# Patient Record
Sex: Female | Born: 1935 | Race: Black or African American | Hispanic: No | Marital: Married
Health system: Southern US, Community
[De-identification: ages and names within clinical notes are randomized; demographics above are authoritative.]

---

## 2008-06-11 ENCOUNTER — Emergency Department: Payer: Self-pay | Admitting: Emergency Medicine

## 2008-06-11 ENCOUNTER — Other Ambulatory Visit: Payer: Self-pay

## 2008-06-13 ENCOUNTER — Ambulatory Visit: Payer: Self-pay | Admitting: Internal Medicine

## 2008-06-27 ENCOUNTER — Ambulatory Visit: Payer: Self-pay | Admitting: Oncology

## 2008-06-28 ENCOUNTER — Ambulatory Visit: Payer: Self-pay | Admitting: Oncology

## 2008-07-02 ENCOUNTER — Emergency Department: Payer: Self-pay | Admitting: Emergency Medicine

## 2008-07-05 ENCOUNTER — Ambulatory Visit: Payer: Self-pay | Admitting: Oncology

## 2008-07-06 ENCOUNTER — Ambulatory Visit: Payer: Self-pay | Admitting: Oncology

## 2008-07-09 ENCOUNTER — Ambulatory Visit: Payer: Self-pay | Admitting: Oncology

## 2008-07-20 ENCOUNTER — Ambulatory Visit: Payer: Self-pay | Admitting: Oncology

## 2008-07-27 ENCOUNTER — Ambulatory Visit: Payer: Self-pay | Admitting: Oncology

## 2008-07-28 ENCOUNTER — Ambulatory Visit: Payer: Self-pay | Admitting: Oncology

## 2008-08-18 ENCOUNTER — Emergency Department: Payer: Self-pay | Admitting: Emergency Medicine

## 2008-08-28 ENCOUNTER — Ambulatory Visit: Payer: Self-pay | Admitting: Oncology

## 2008-09-27 ENCOUNTER — Ambulatory Visit: Payer: Self-pay | Admitting: Oncology

## 2008-10-28 ENCOUNTER — Ambulatory Visit: Payer: Self-pay | Admitting: Oncology

## 2008-11-28 ENCOUNTER — Ambulatory Visit: Payer: Self-pay | Admitting: Oncology

## 2008-12-26 ENCOUNTER — Ambulatory Visit: Payer: Self-pay | Admitting: Oncology

## 2009-01-26 ENCOUNTER — Ambulatory Visit: Payer: Self-pay | Admitting: Oncology

## 2009-07-04 ENCOUNTER — Inpatient Hospital Stay: Payer: Self-pay | Admitting: Internal Medicine

## 2009-09-29 ENCOUNTER — Inpatient Hospital Stay: Payer: Self-pay | Admitting: Internal Medicine

## 2010-11-07 ENCOUNTER — Inpatient Hospital Stay: Payer: Self-pay | Admitting: Internal Medicine

## 2011-02-25 ENCOUNTER — Ambulatory Visit: Payer: Self-pay | Admitting: Internal Medicine

## 2011-02-25 ENCOUNTER — Ambulatory Visit: Payer: Self-pay | Admitting: Oncology

## 2011-03-04 ENCOUNTER — Inpatient Hospital Stay: Payer: Self-pay | Admitting: Internal Medicine

## 2011-03-17 ENCOUNTER — Ambulatory Visit: Payer: Self-pay | Admitting: Oncology

## 2011-03-28 ENCOUNTER — Ambulatory Visit: Payer: Self-pay | Admitting: Oncology

## 2011-03-28 ENCOUNTER — Ambulatory Visit: Payer: Self-pay | Admitting: Internal Medicine

## 2011-04-27 ENCOUNTER — Ambulatory Visit: Payer: Self-pay | Admitting: Internal Medicine

## 2011-04-27 ENCOUNTER — Ambulatory Visit: Payer: Self-pay | Admitting: Oncology

## 2011-05-04 ENCOUNTER — Emergency Department: Payer: Self-pay | Admitting: Unknown Physician Specialty

## 2011-05-19 ENCOUNTER — Emergency Department: Payer: Self-pay | Admitting: Emergency Medicine

## 2011-05-28 ENCOUNTER — Ambulatory Visit: Payer: Self-pay | Admitting: Oncology

## 2011-05-28 ENCOUNTER — Ambulatory Visit: Payer: Self-pay | Admitting: Internal Medicine

## 2011-06-03 ENCOUNTER — Inpatient Hospital Stay: Payer: Self-pay | Admitting: Internal Medicine

## 2011-06-28 ENCOUNTER — Ambulatory Visit: Payer: Self-pay | Admitting: Oncology

## 2011-07-07 ENCOUNTER — Ambulatory Visit: Payer: Self-pay | Admitting: Oncology

## 2011-07-28 ENCOUNTER — Ambulatory Visit: Payer: Self-pay | Admitting: Oncology

## 2011-09-01 ENCOUNTER — Ambulatory Visit: Payer: Self-pay | Admitting: Oncology

## 2011-09-27 ENCOUNTER — Ambulatory Visit: Payer: Self-pay | Admitting: Oncology

## 2011-11-03 ENCOUNTER — Ambulatory Visit: Payer: Self-pay | Admitting: Oncology

## 2011-11-03 LAB — COMPREHENSIVE METABOLIC PANEL
Albumin: 3.1 g/dL — ABNORMAL LOW (ref 3.4–5.0)
Anion Gap: 6 — ABNORMAL LOW (ref 7–16)
BUN: 18 mg/dL (ref 7–18)
Calcium, Total: 9.2 mg/dL (ref 8.5–10.1)
Chloride: 107 mmol/L (ref 98–107)
Creatinine: 1.16 mg/dL (ref 0.60–1.30)
EGFR (African American): 59 — ABNORMAL LOW
EGFR (Non-African Amer.): 49 — ABNORMAL LOW
Potassium: 4.2 mmol/L (ref 3.5–5.1)
SGOT(AST): 17 U/L (ref 15–37)
SGPT (ALT): 14 U/L
Total Protein: 6.5 g/dL (ref 6.4–8.2)

## 2011-11-03 LAB — CBC CANCER CENTER
Basophil #: 0 x10 3/mm (ref 0.0–0.1)
Eosinophil #: 0.1 x10 3/mm (ref 0.0–0.7)
HCT: 35.1 % (ref 35.0–47.0)
MCH: 27.6 pg (ref 26.0–34.0)
MCHC: 32.8 g/dL (ref 32.0–36.0)
Monocyte #: 0.2 x10 3/mm (ref 0.0–0.7)
Monocyte %: 7.8 %
Neutrophil %: 52.6 %
Platelet: 197 x10 3/mm (ref 150–440)
RBC: 4.18 10*6/uL (ref 3.80–5.20)
RDW: 15.1 % — ABNORMAL HIGH (ref 11.5–14.5)

## 2011-11-28 ENCOUNTER — Ambulatory Visit: Payer: Self-pay | Admitting: Oncology

## 2012-02-02 ENCOUNTER — Ambulatory Visit: Payer: Self-pay | Admitting: Oncology

## 2012-02-02 LAB — COMPREHENSIVE METABOLIC PANEL
Albumin: 3.2 g/dL — ABNORMAL LOW (ref 3.4–5.0)
Alkaline Phosphatase: 69 U/L (ref 50–136)
Anion Gap: 9 (ref 7–16)
Bilirubin,Total: 0.3 mg/dL (ref 0.2–1.0)
Calcium, Total: 9 mg/dL (ref 8.5–10.1)
Creatinine: 1.19 mg/dL (ref 0.60–1.30)
EGFR (African American): 57 — ABNORMAL LOW
Glucose: 109 mg/dL — ABNORMAL HIGH (ref 65–99)
Osmolality: 286 (ref 275–301)
Potassium: 4.4 mmol/L (ref 3.5–5.1)
SGOT(AST): 16 U/L (ref 15–37)
SGPT (ALT): 17 U/L
Total Protein: 6.9 g/dL (ref 6.4–8.2)

## 2012-02-02 LAB — CBC CANCER CENTER
Basophil %: 0.7 %
Eosinophil #: 0.1 x10 3/mm (ref 0.0–0.7)
Eosinophil %: 3 %
HGB: 11 g/dL — ABNORMAL LOW (ref 12.0–16.0)
Lymphocyte #: 1.2 x10 3/mm (ref 1.0–3.6)
Lymphocyte %: 24.6 %
MCH: 27.5 pg (ref 26.0–34.0)
Monocyte #: 0.3 x10 3/mm (ref 0.0–0.7)
Monocyte %: 5.4 %
RDW: 13.4 % (ref 11.5–14.5)

## 2012-02-25 ENCOUNTER — Ambulatory Visit: Payer: Self-pay | Admitting: Oncology

## 2012-05-03 ENCOUNTER — Ambulatory Visit: Payer: Self-pay | Admitting: Oncology

## 2012-05-03 LAB — COMPREHENSIVE METABOLIC PANEL
Anion Gap: 7 (ref 7–16)
BUN: 15 mg/dL (ref 7–18)
Bilirubin,Total: 0.4 mg/dL (ref 0.2–1.0)
Chloride: 106 mmol/L (ref 98–107)
Co2: 28 mmol/L (ref 21–32)
Creatinine: 1.17 mg/dL (ref 0.60–1.30)
EGFR (African American): 53 — ABNORMAL LOW
EGFR (Non-African Amer.): 46 — ABNORMAL LOW
Osmolality: 283 (ref 275–301)
SGOT(AST): 14 U/L — ABNORMAL LOW (ref 15–37)
SGPT (ALT): 15 U/L
Sodium: 141 mmol/L (ref 136–145)
Total Protein: 6.5 g/dL (ref 6.4–8.2)

## 2012-05-03 LAB — CBC CANCER CENTER
Basophil #: 0 x10 3/mm (ref 0.0–0.1)
Basophil %: 0.7 %
Eosinophil #: 0.1 x10 3/mm (ref 0.0–0.7)
HCT: 34.4 % — ABNORMAL LOW (ref 35.0–47.0)
HGB: 11 g/dL — ABNORMAL LOW (ref 12.0–16.0)
Lymphocyte %: 31.6 %
MCHC: 32 g/dL (ref 32.0–36.0)
MCV: 86 fL (ref 80–100)
Monocyte %: 8.6 %
Neutrophil #: 1.6 x10 3/mm (ref 1.4–6.5)
Neutrophil %: 55.2 %
RDW: 14.2 % (ref 11.5–14.5)
WBC: 3 x10 3/mm — ABNORMAL LOW (ref 3.6–11.0)

## 2012-05-27 ENCOUNTER — Ambulatory Visit: Payer: Self-pay | Admitting: Oncology

## 2012-07-26 ENCOUNTER — Inpatient Hospital Stay: Payer: Self-pay | Admitting: Internal Medicine

## 2012-07-26 LAB — COMPREHENSIVE METABOLIC PANEL WITH GFR
Albumin: 4.1 g/dL
Alkaline Phosphatase: 61 U/L
Anion Gap: 8
BUN: 13 mg/dL
Bilirubin,Total: 0.7 mg/dL
Calcium, Total: 9.9 mg/dL
Chloride: 104 mmol/L
Co2: 29 mmol/L
Creatinine: 1.22 mg/dL
EGFR (African American): 50 — ABNORMAL LOW
EGFR (Non-African Amer.): 43 — ABNORMAL LOW
Glucose: 93 mg/dL
Osmolality: 281
Potassium: 3.6 mmol/L
SGOT(AST): 17 U/L
SGPT (ALT): 14 U/L
Sodium: 141 mmol/L
Total Protein: 7.6 g/dL

## 2012-07-26 LAB — URINALYSIS, COMPLETE
Bilirubin,UR: NEGATIVE
Blood: NEGATIVE
Glucose,UR: NEGATIVE mg/dL
Ketone: NEGATIVE
Nitrite: NEGATIVE
Ph: 7
Protein: NEGATIVE
RBC,UR: 1 /HPF
Specific Gravity: 1.003
Squamous Epithelial: 2
WBC UR: 8 /HPF

## 2012-07-26 LAB — TROPONIN I: Troponin-I: 0.02 ng/mL

## 2012-07-26 LAB — CBC
HCT: 37.6 %
HGB: 12.4 g/dL
MCH: 28.2 pg
MCHC: 33.1 g/dL
MCV: 85 fL
Platelet: 174 10*3/uL
RBC: 4.42 X10 6/mm 3
RDW: 13.1 %
WBC: 3.9 10*3/uL

## 2012-07-27 LAB — LIPID PANEL
HDL Cholesterol: 50 mg/dL (ref 40–60)
Ldl Cholesterol, Calc: 153 mg/dL — ABNORMAL HIGH (ref 0–100)
Triglycerides: 57 mg/dL (ref 0–200)
VLDL Cholesterol, Calc: 11 mg/dL (ref 5–40)

## 2012-07-27 LAB — HEMOGLOBIN A1C: Hemoglobin A1C: 5.1 % (ref 4.2–6.3)

## 2012-08-02 ENCOUNTER — Ambulatory Visit: Payer: Self-pay | Admitting: Oncology

## 2012-08-02 LAB — COMPREHENSIVE METABOLIC PANEL
Albumin: 4.1 g/dL (ref 3.4–5.0)
Alkaline Phosphatase: 52 U/L (ref 50–136)
BUN: 12 mg/dL (ref 7–18)
Calcium, Total: 9.9 mg/dL (ref 8.5–10.1)
Chloride: 106 mmol/L (ref 98–107)
Co2: 26 mmol/L (ref 21–32)
EGFR (African American): 54 — ABNORMAL LOW
Glucose: 93 mg/dL (ref 65–99)
Potassium: 4.2 mmol/L (ref 3.5–5.1)
SGOT(AST): 19 U/L (ref 15–37)
SGPT (ALT): 21 U/L (ref 12–78)
Total Protein: 7.2 g/dL (ref 6.4–8.2)

## 2012-08-02 LAB — CBC CANCER CENTER
Basophil %: 1 %
Eosinophil #: 0.1 x10 3/mm (ref 0.0–0.7)
Eosinophil %: 3.4 %
HCT: 34.2 % — ABNORMAL LOW (ref 35.0–47.0)
HGB: 10.7 g/dL — ABNORMAL LOW (ref 12.0–16.0)
Lymphocyte #: 0.9 x10 3/mm — ABNORMAL LOW (ref 1.0–3.6)
MCH: 27.1 pg (ref 26.0–34.0)
MCHC: 31.4 g/dL — ABNORMAL LOW (ref 32.0–36.0)
MCV: 86 fL (ref 80–100)
Monocyte #: 0.3 x10 3/mm (ref 0.2–0.9)
Monocyte %: 10.2 %
Neutrophil #: 1.6 x10 3/mm (ref 1.4–6.5)
Neutrophil %: 54.6 %
Platelet: 136 x10 3/mm — ABNORMAL LOW (ref 150–440)
RBC: 3.96 10*6/uL (ref 3.80–5.20)
RDW: 13.5 % (ref 11.5–14.5)
WBC: 2.9 x10 3/mm — ABNORMAL LOW (ref 3.6–11.0)

## 2012-08-27 ENCOUNTER — Ambulatory Visit: Payer: Self-pay | Admitting: Oncology

## 2012-11-01 ENCOUNTER — Ambulatory Visit: Payer: Self-pay | Admitting: Oncology

## 2012-11-01 LAB — COMPREHENSIVE METABOLIC PANEL
Albumin: 3.9 g/dL (ref 3.4–5.0)
Alkaline Phosphatase: 62 U/L (ref 50–136)
Anion Gap: 7 (ref 7–16)
Bilirubin,Total: 0.4 mg/dL (ref 0.2–1.0)
Calcium, Total: 9.6 mg/dL (ref 8.5–10.1)
Creatinine: 1.07 mg/dL (ref 0.60–1.30)
Osmolality: 283 (ref 275–301)
Potassium: 3.9 mmol/L (ref 3.5–5.1)
SGOT(AST): 16 U/L (ref 15–37)
SGPT (ALT): 17 U/L (ref 12–78)
Sodium: 141 mmol/L (ref 136–145)
Total Protein: 7.3 g/dL (ref 6.4–8.2)

## 2012-11-01 LAB — CBC CANCER CENTER
Basophil %: 0.7 %
Eosinophil %: 2.7 %
HGB: 11.5 g/dL — ABNORMAL LOW (ref 12.0–16.0)
Lymphocyte #: 0.8 x10 3/mm — ABNORMAL LOW (ref 1.0–3.6)
MCH: 27.3 pg (ref 26.0–34.0)
MCV: 86 fL (ref 80–100)
Monocyte #: 0.3 x10 3/mm (ref 0.2–0.9)
Neutrophil #: 1.9 x10 3/mm (ref 1.4–6.5)
Neutrophil %: 61.9 %
RBC: 4.23 10*6/uL (ref 3.80–5.20)
RDW: 13.7 % (ref 11.5–14.5)

## 2012-11-02 LAB — CANCER ANTIGEN 27.29: CA 27.29: 79.5 U/mL — ABNORMAL HIGH (ref 0.0–38.6)

## 2012-11-27 ENCOUNTER — Ambulatory Visit: Payer: Self-pay | Admitting: Oncology

## 2013-02-15 IMAGING — CR DG CHEST 1V PORT
1 series · 1 of 1 positions shown · non-contrast
Comparison: none

REASON FOR EXAM: Shortness of Breath
COMMENTS:

PROCEDURE:     DXR - DXR PORTABLE CHEST SINGLE VIEW  - May 04, 2011 [DATE]
RESULT:     Comparison: None

[view not recorded]
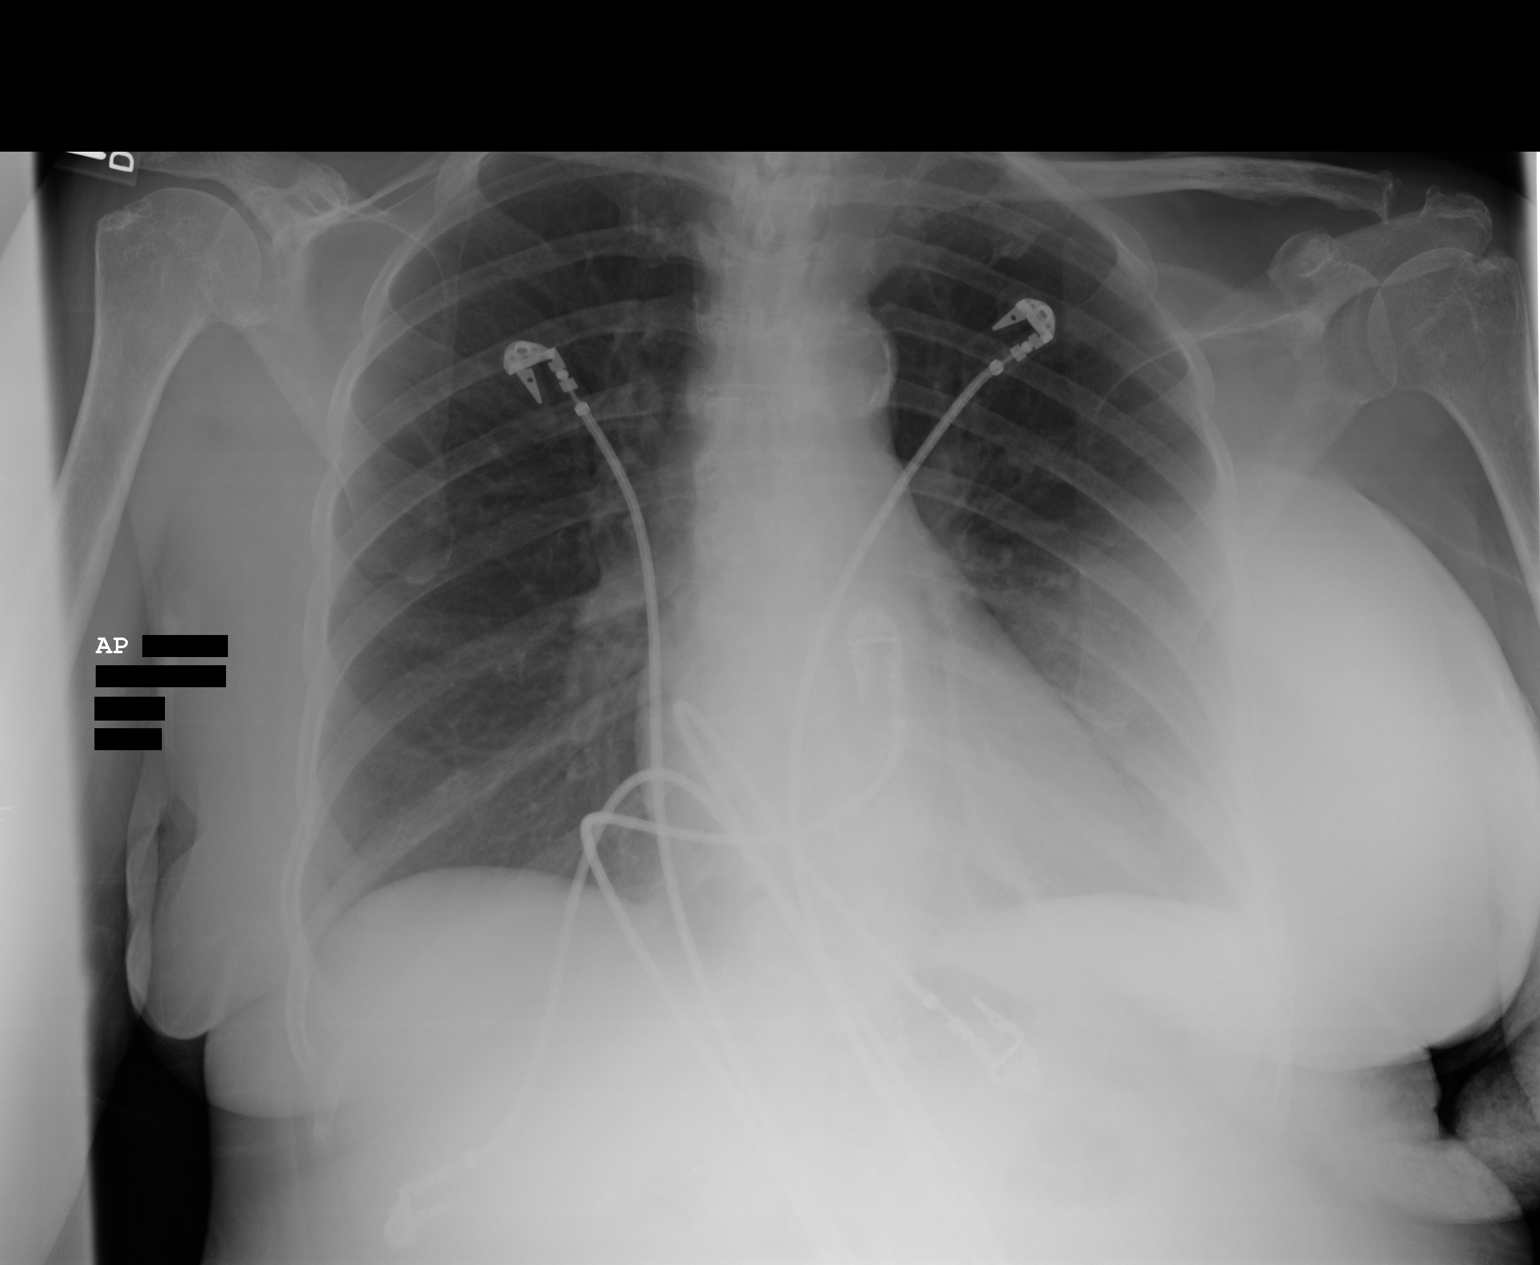

[1 of 1 positions shown; findings below may reference images not displayed]

FINDINGS: Single portable AP chest radiograph is provided.  There is no focal
parenchymal opacity, pleural effusion, or pneumothorax. Normal
cardiomediastinal silhouette. The osseous structures are unremarkable.

There is evidence of prior right mastectomy.
IMPRESSION: No acute disease of the chest.

## 2013-02-24 ENCOUNTER — Ambulatory Visit: Payer: Self-pay | Admitting: Oncology

## 2013-03-01 LAB — CBC CANCER CENTER
Basophil #: 0 x10 3/mm (ref 0.0–0.1)
Basophil %: 1 %
Eosinophil #: 0.1 x10 3/mm (ref 0.0–0.7)
HCT: 31.1 % — ABNORMAL LOW (ref 35.0–47.0)
HGB: 9.9 g/dL — ABNORMAL LOW (ref 12.0–16.0)
Lymphocyte #: 0.7 x10 3/mm — ABNORMAL LOW (ref 1.0–3.6)
Lymphocyte %: 26.9 %
MCH: 26.6 pg (ref 26.0–34.0)
MCHC: 31.9 g/dL — ABNORMAL LOW (ref 32.0–36.0)
MCV: 83 fL (ref 80–100)
Monocyte #: 0.2 x10 3/mm (ref 0.2–0.9)
Neutrophil #: 1.6 x10 3/mm (ref 1.4–6.5)
Neutrophil %: 60.7 %
Platelet: 162 x10 3/mm (ref 150–440)
RBC: 3.73 10*6/uL — ABNORMAL LOW (ref 3.80–5.20)
WBC: 2.7 x10 3/mm — ABNORMAL LOW (ref 3.6–11.0)

## 2013-03-27 ENCOUNTER — Ambulatory Visit: Payer: Self-pay | Admitting: Oncology

## 2013-04-12 LAB — IRON AND TIBC
Iron Bind.Cap.(Total): 351 ug/dL (ref 250–450)
Unbound Iron-Bind.Cap.: 297 ug/dL

## 2013-04-26 ENCOUNTER — Ambulatory Visit: Payer: Self-pay | Admitting: Oncology

## 2013-06-16 ENCOUNTER — Emergency Department: Payer: Self-pay | Admitting: Emergency Medicine

## 2013-06-16 LAB — URINALYSIS, COMPLETE
Glucose,UR: 50 mg/dL (ref 0–75)
Nitrite: NEGATIVE
Ph: 6 (ref 4.5–8.0)
Protein: 100
RBC,UR: 30 /HPF (ref 0–5)
Squamous Epithelial: 2
WBC UR: 872 /HPF (ref 0–5)

## 2013-06-16 LAB — COMPREHENSIVE METABOLIC PANEL
Alkaline Phosphatase: 54 U/L (ref 50–136)
Anion Gap: 2 — ABNORMAL LOW (ref 7–16)
BUN: 14 mg/dL (ref 7–18)
Calcium, Total: 8.8 mg/dL (ref 8.5–10.1)
Chloride: 106 mmol/L (ref 98–107)
Creatinine: 1.08 mg/dL (ref 0.60–1.30)
EGFR (African American): 58 — ABNORMAL LOW
Osmolality: 279 (ref 275–301)
SGOT(AST): 12 U/L — ABNORMAL LOW (ref 15–37)
SGPT (ALT): 11 U/L — ABNORMAL LOW (ref 12–78)
Sodium: 139 mmol/L (ref 136–145)

## 2013-06-16 LAB — CBC
HGB: 8.5 g/dL — ABNORMAL LOW (ref 12.0–16.0)
MCH: 26.3 pg (ref 26.0–34.0)
MCV: 79 fL — ABNORMAL LOW (ref 80–100)
Platelet: 159 10*3/uL (ref 150–440)
RBC: 3.22 10*6/uL — ABNORMAL LOW (ref 3.80–5.20)

## 2013-06-16 LAB — TROPONIN I: Troponin-I: 0.02 ng/mL

## 2013-06-19 LAB — URINE CULTURE

## 2013-08-15 ENCOUNTER — Ambulatory Visit: Payer: Self-pay | Admitting: Oncology

## 2013-08-15 LAB — CBC CANCER CENTER
Basophil #: 0 x10 3/mm (ref 0.0–0.1)
Basophil %: 0.7 %
HCT: 20.4 % — ABNORMAL LOW (ref 35.0–47.0)
MCH: 22.2 pg — ABNORMAL LOW (ref 26.0–34.0)
MCHC: 30.7 g/dL — ABNORMAL LOW (ref 32.0–36.0)
MCV: 72 fL — ABNORMAL LOW (ref 80–100)
Monocyte #: 0.4 x10 3/mm (ref 0.2–0.9)
Monocyte %: 9.7 %
Neutrophil %: 64.1 %
RBC: 2.82 10*6/uL — ABNORMAL LOW (ref 3.80–5.20)
RDW: 15.5 % — ABNORMAL HIGH (ref 11.5–14.5)

## 2013-08-15 LAB — COMPREHENSIVE METABOLIC PANEL
Albumin: 3.4 g/dL (ref 3.4–5.0)
Anion Gap: 4 — ABNORMAL LOW (ref 7–16)
BUN: 17 mg/dL (ref 7–18)
Bilirubin,Total: 0.4 mg/dL (ref 0.2–1.0)
Calcium, Total: 8.8 mg/dL (ref 8.5–10.1)
Chloride: 106 mmol/L (ref 98–107)
Creatinine: 1.23 mg/dL (ref 0.60–1.30)
Glucose: 79 mg/dL (ref 65–99)
Osmolality: 280 (ref 275–301)
Potassium: 4.7 mmol/L (ref 3.5–5.1)
SGPT (ALT): 13 U/L (ref 12–78)
Total Protein: 6.5 g/dL (ref 6.4–8.2)

## 2013-08-27 ENCOUNTER — Ambulatory Visit: Payer: Self-pay | Admitting: Oncology

## 2013-09-26 DEATH — deceased

## 2013-12-16 ENCOUNTER — Ambulatory Visit: Payer: Self-pay | Admitting: Oncology

## 2014-05-10 IMAGING — CR DG ABDOMEN 1V
1 series · 1 of 1 positions shown · non-contrast
Comparison: none

REASON FOR EXAM: says is constipated-rectum empty
COMMENTS:

PROCEDURE:     DXR - DXR KIDNEY URETER BLADDER  - July 26, 2012  [DATE]
RESULT:     Comparison: None.

[x abdomen supine]
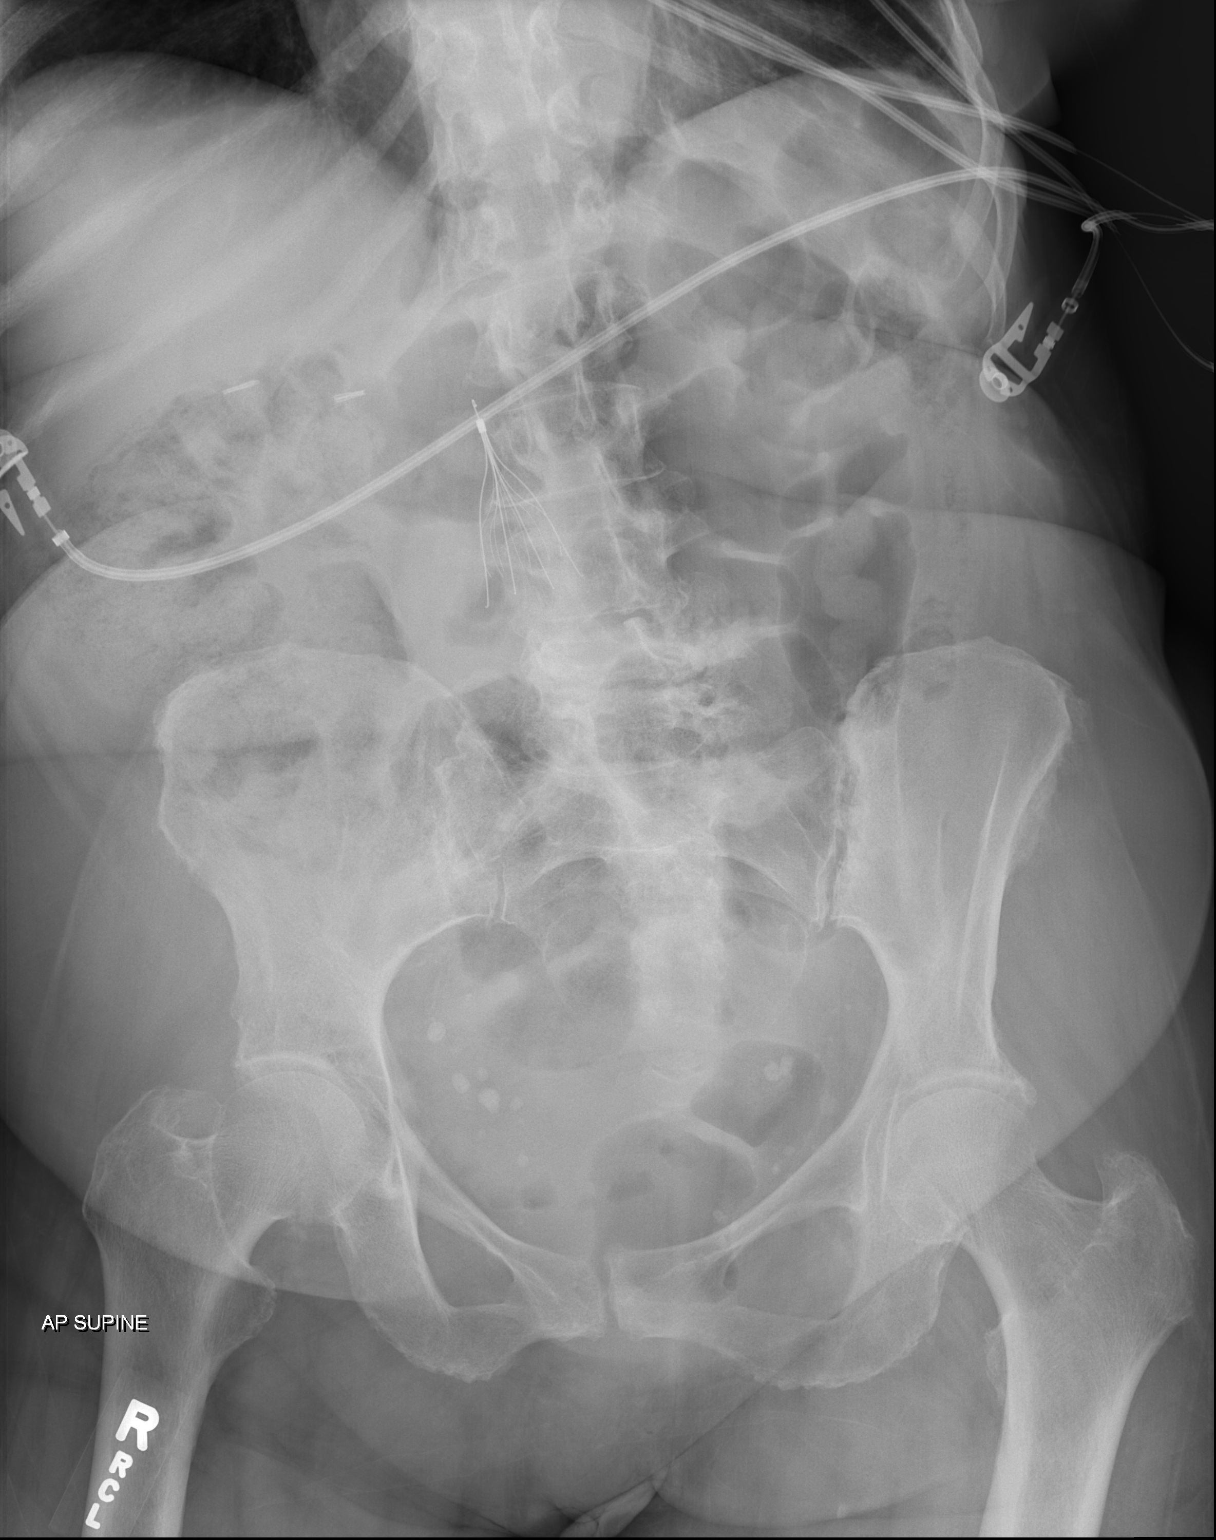

[1 of 1 positions shown; findings below may reference images not displayed]

FINDINGS: Supine technique limits evaluation for free intraperitoneal air. Air is seen
within nondilated large bowel. There is stool in the ascending colon. There
is a relative paucity of small bowel gas, limiting its evaluation. However,
no evidence of dilated loops of small bowel. An IVC filter is present.
Calcifications in the pelvis likely represent phleboliths.
IMPRESSION: Nonobstructed bowel gas pattern.

[REDACTED]

## 2014-05-10 IMAGING — CT CT HEAD WITHOUT CONTRAST
1 of 2 series · 13 of 30 positions shown, 17 images · non-contrast
Comparison: none

REASON FOR EXAM: weak and dizzy
COMMENTS:

PROCEDURE:     CT  - CT HEAD WITHOUT CONTRAST  - July 26, 2012  [DATE]
RESULT:     Comparison:  None
TECHNIQUE: Multiple axial images from the foramen magnum to the vertex were
obtained without IV contrast.

[Series 4: soft tissue 2 · axial · 0.41mm/px · z∈[+418,+560]mm · 13 of 35 slices shown, 17 images]
[im 3/35  brain]
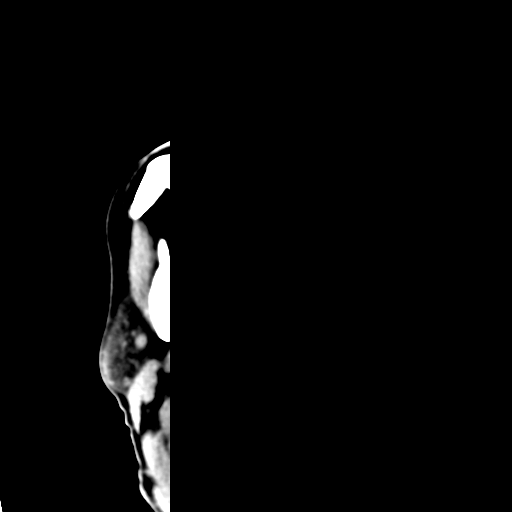
[im 3/35  bone]
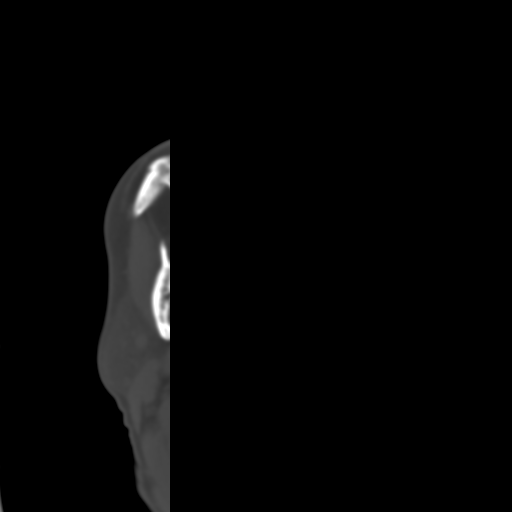
[im 5/35  brain]
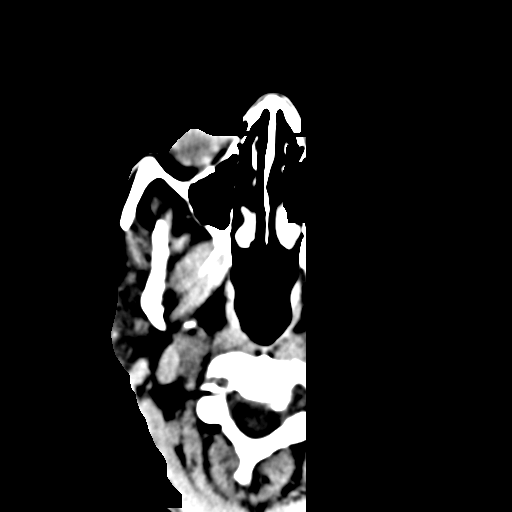
[im 8/35  brain]
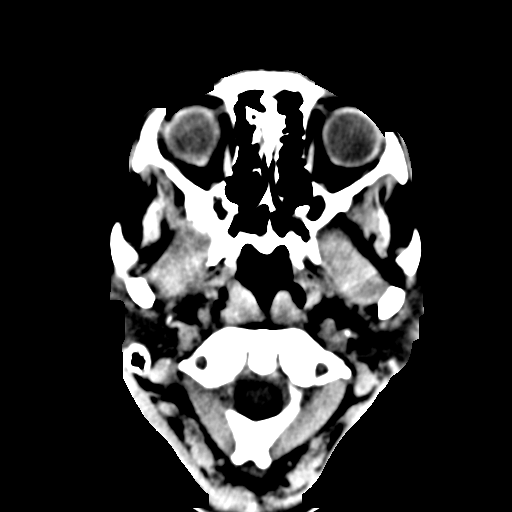
[im 10/35  brain]
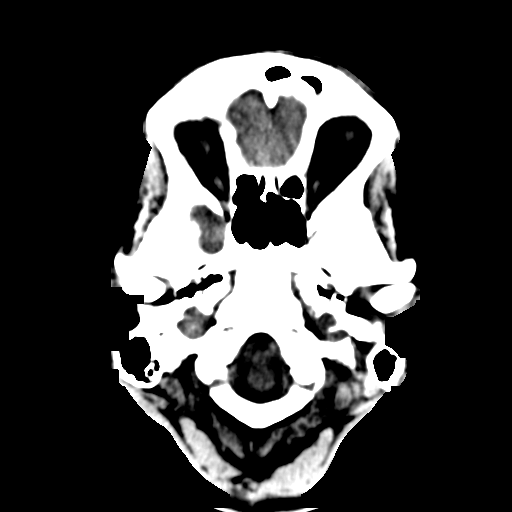
[im 13/35  brain]
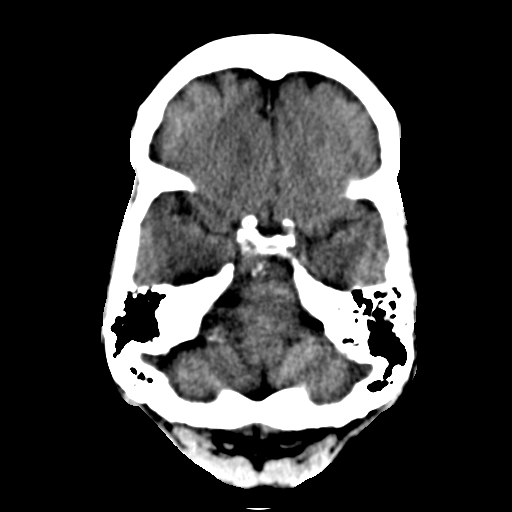
[im 13/35  bone]
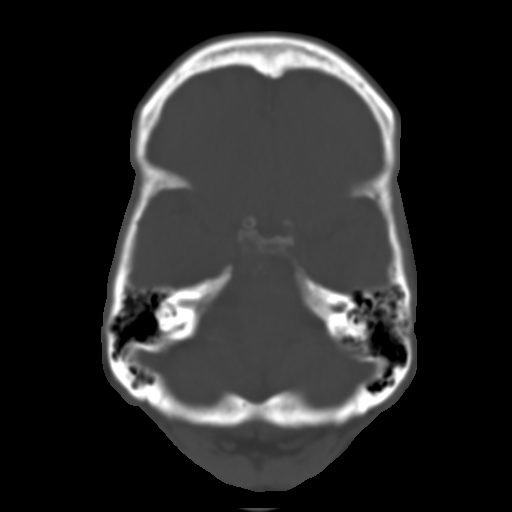
[im 15/35  brain]
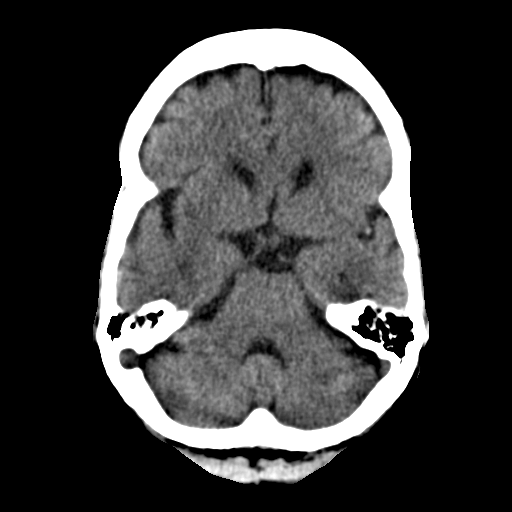
[im 18/35  brain]
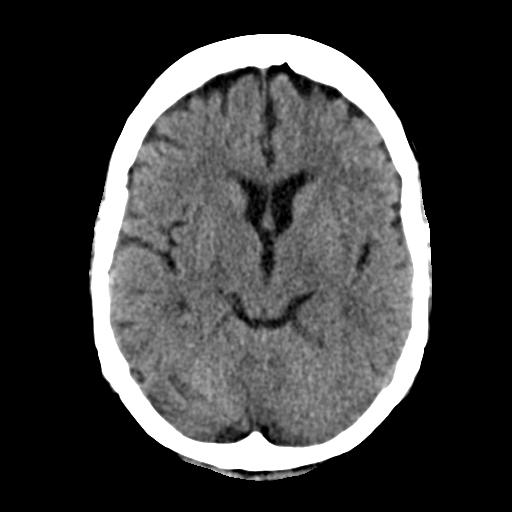
[im 20/35  brain]
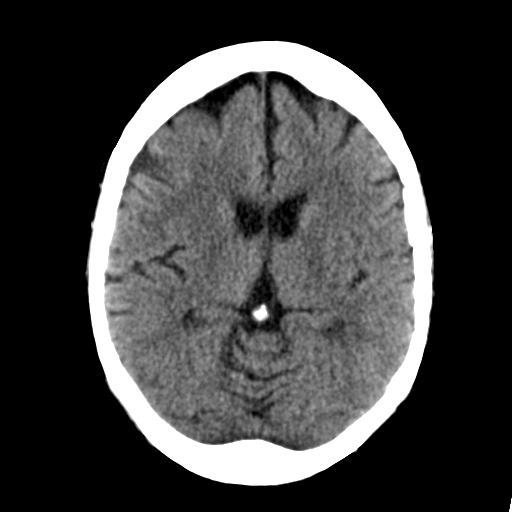
[im 22/35  brain]
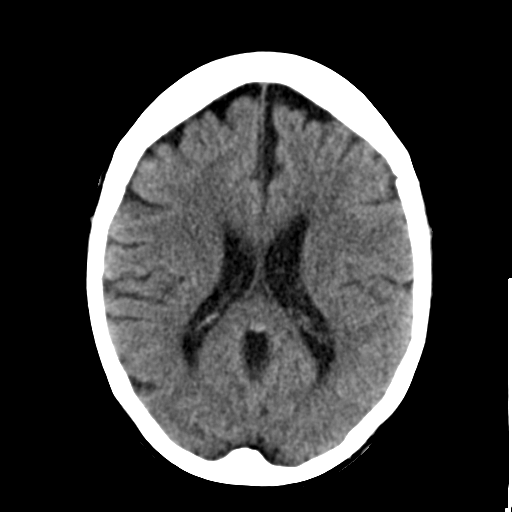
[im 22/35  bone]
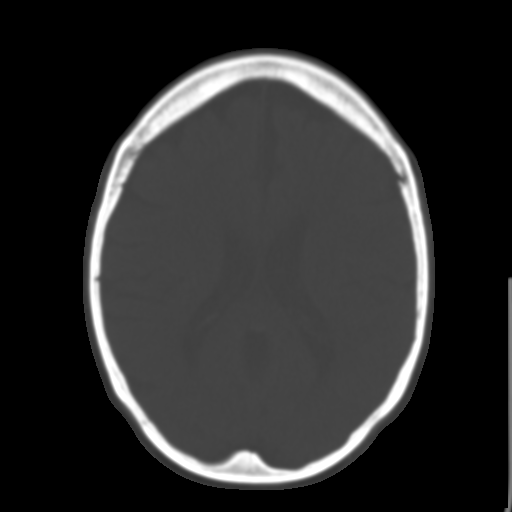
[im 25/35  brain]
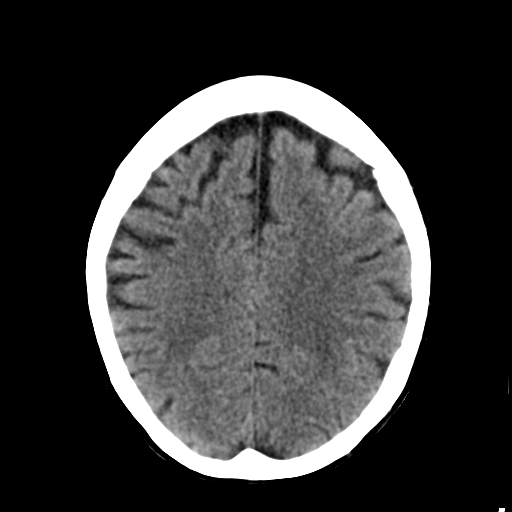
[im 27/35  brain]
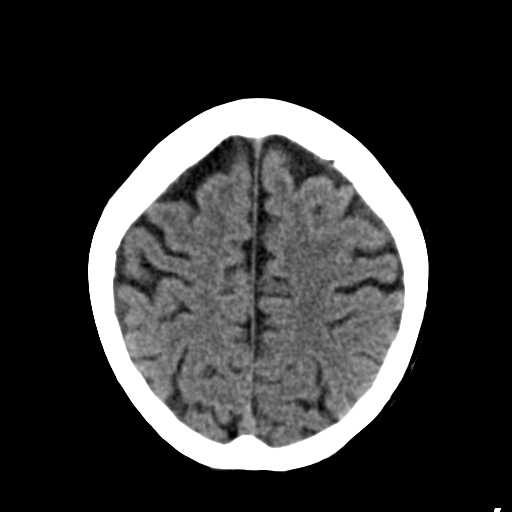
[im 30/35  brain]
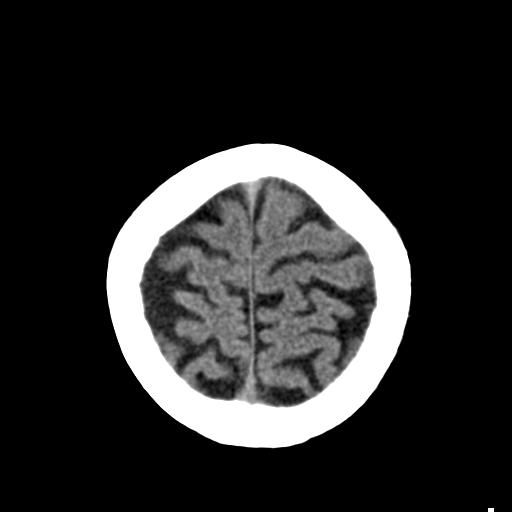
[im 32/35  brain]
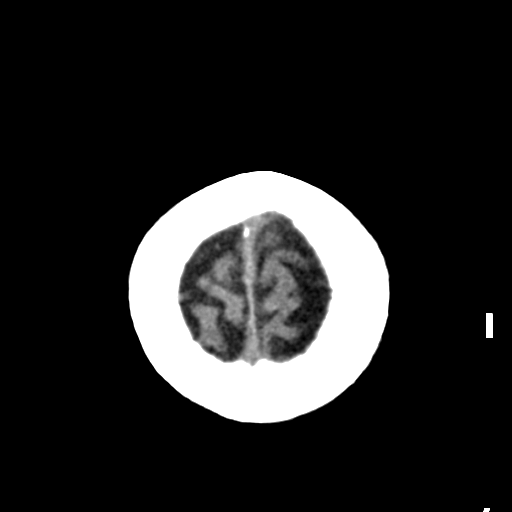
[im 32/35  bone]
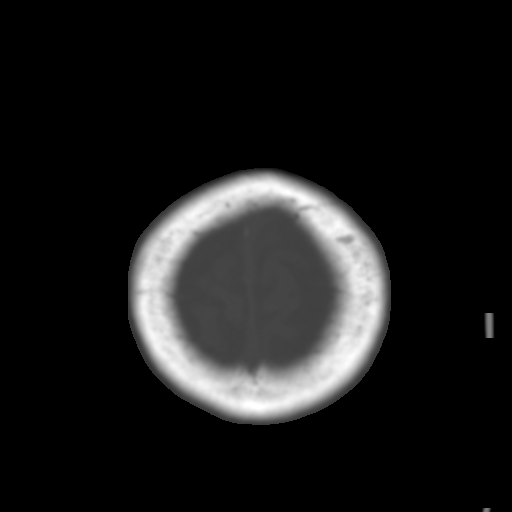

[13 of 30 positions shown; findings below may reference images not displayed]

FINDINGS: There is no evidence of mass effect, midline shift, or extra-axial fluid
collections.  There is no evidence of a space-occupying lesion or
intracranial hemorrhage. There is no evidence of a cortical-based area of
acute infarction. There is generalized cerebral atrophy. There is
periventricular white matter low attenuation likely secondary to
microangiopathy.

The ventricles and sulci are appropriate for the patient's age. The basal
cisterns are patent.

Visualized portions of the orbits are unremarkable. The visualized portions
of the paranasal sinuses and mastoid air cells are unremarkable.
Cerebrovascular atherosclerotic calcifications are noted.

The osseous structures are unremarkable.
IMPRESSION: No acute intracranial process.

[REDACTED]

## 2015-02-13 NOTE — Discharge Summary (Signed)
PATIENT NAME:  Diana PurpuraSPRINGFIELD, Diana Larsen MR#:  [id number] DATE OF BIRTH:  Mar 26, 1935  DATE OF ADMISSION:  07/26/2012 DATE OF DISCHARGE:  07/27/2012  PRIMARY CARE PHYSICIAN: Toy CookeyErnest Eason, MD   DISCHARGE DIAGNOSES:  1. Malignant hypertension.  2. Constipation.   IMAGING STUDIES:  1. CT scan of the head without contrast showed no acute intracranial process.  2. Chest x-ray showed no acute cardiopulmonary disease.  3. KUB x-ray showed nonobstructing bowel gas pattern.   ADMITTING HISTORY AND PHYSICAL: Please see detailed history and physical dictated on 07/26/2012. In brief, this is a 80 year old African American female patient with history of hyperlipidemia, hypertension, CKD, and chronic anemia who presented to the Emergency Room with complaints of dizziness and constipation. The patient was found to have elevated blood pressure of 214/98 and was admitted for dizziness secondary to uncontrolled hypertension and constipation.   HOSPITAL COURSE:  1. The patient was given two doses of clonidine in the ER. In addition, metoprolol and lisinopril were added by the admitting physician with which her blood pressure came down to 92/52 after which metoprolol was stopped and the patient is being continued on lisinopril. The patient's blood pressure at this time is 155/49 and is slowly trending down. The patient will follow-up with her primary care physician. I have also advised her to check her blood pressure twice a day and given her a prescription for blood pressure machine to keep a log and take to her primary care physician.  2. Constipation. This was secondary to decreased mobility. The patient has been started on laxatives, presently is getting an enema, and will be discharged after she has a bowel movement. X-ray showed no bowel obstruction or ileus.   On the day of discharge the patient's cardiac examination is normal. Blood pressure 155/69, pulse 65, saturating 98% on room air.   DISCHARGE MEDICATIONS:   1. Lisinopril 5 mg oral once a day.  2. Ferrous sulfate 325 mg oral once a day.  3. Letrozole 2.5 mg oral once a day.  4. Omeprazole 20 mg oral once a day.  5. Multivitamin 1 tablet oral once a day.  6. Acetaminophen 500 mg oral every six hours as needed for pain.  7. Ranitidine 25 mg oral 2 times a day.  8. Bisacodyl 5 mg oral delayed-release tablet.  9. MiraLAX 17 grams oral once a day as needed for constipation.   DISCHARGE INSTRUCTIONS:  1. Follow-up with primary care physician within a week.  2. Keep a log of blood pressures and a blood pressure machine prescription was given.  3. The patient will be on a cardiac low salt diet with activity as tolerated with assistance.   This plan was discussed with the patient and her husband at bedside who verbalized understanding and is okay with the plan.   TIME SPENT: Time spent today on this discharge dictation along with coordinating care and counseling of the patient was 35 minutes.    ____________________________ Molinda BailiffSrikar R. Yarelin Reichardt, MD srs:drc D: 07/27/2012 14:09:42 ET T: 07/28/2012 10:09:27 ET JOB#: [id number]  cc: Wardell HeathSrikar R. Deyanna Mctier, MD, <Dictator> Serita ShellerErnest Larsen. Maryellen PileEason, MD Orie FishermanSRIKAR R Chinmayi Rumer MD ELECTRONICALLY SIGNED 07/29/2012 11:20

## 2015-02-13 NOTE — H&P (Signed)
PATIENT NAME:  Diana Larsen, HOSSAIN MR#:  [id number] DATE OF BIRTH:  02-28-1935  DATE OF ADMISSION:  07/26/2012  PRIMARY CARE PHYSICIAN: Dr. Maryellen Pile REFERRING PHYSICIAN: Dr. Darnelle Catalan   CHIEF COMPLAINT: Weakness and dizziness today, constipation for one week.   HISTORY OF PRESENT ILLNESS: 80 year old African American female with history of hyperlipidemia, hypertension, chronic kidney disease, chronic anemia presented to the ED with above chief complaint. Patient denies any symptoms, however, according to patient's son and husband patient has weakness and dizziness today with mild shortness of breath. Patient also has constipation for the past one week so they brought patient to ED for further evaluation. Patient's blood pressure was 214/98, was treated with clonidine. According to patient's son, patient has hypertension and took hypertension medication but was taken off hypertension medication for a long time.   PAST MEDICAL HISTORY: As mentioned above:  1. Hypertension.  2. Hyperlipidemia.  3. Chronic kidney disease. 4. Chronic anemia. 5. Advanced breast cancer on the left side. 6. Left lower extremity deep vein thrombosis status post IVC filter but not a candidate for anticoagulation.  7. Legally blind both eyes secondary to glaucoma.   PAST SURGICAL HISTORY:  1. Left total knee replacement. 2. Gallbladder surgery. 3. Kidney stone surgery. 4. Partial hysterectomy.   FAMILY HISTORY: Hypertension.   SOCIAL HISTORY: No smoking, alcohol drinking or illicit drugs.    REVIEW OF SYSTEMS: CONSTITUTIONAL: Patient denies any fever, chills. No headache or dizziness. No weakness. EYES: No double vision, blurred vision. ENT: No postnasal drip, epistaxis, slurred speech, or dysphagia. RESPIRATORY: No cough, sputum, shortness of breath, or hemoptysis. CARDIOVASCULAR: No chest pain, palpitation, orthopnea, or nocturnal dyspnea. No leg edema. GASTROINTESTINAL: No abdominal pain, nausea, vomiting, or  diarrhea. No melena or bloody stools but has constipation. GENITOURINARY: No dysuria, hematuria, or incontinence. ENDOCRINE: No polyuria, polydipsia. HEMATOLOGY: No easy bruising, bleeding. SKIN: No rash or jaundice. NEUROLOGY: No syncope, loss of consciousness or seizure but has dizziness. No headache.   ALLERGIES: None.   MEDICATIONS:  1. Calcium 600 mg p.o. daily.  2. Ferrous sulfate 325 mg p.o. daily.  3. Letrozole 2.5 mg p.o. daily.  4. Omeprazole 20 mg p.o. daily.   PHYSICAL EXAMINATION:  VITAL SIGNS: Maximal blood pressure 214/98 now decreased to 152/77, pulse 67, respirations 18, oxygen saturation 100% on room air.   GENERAL: Patient is alert, awake, oriented in no acute distress.   HEENT: Patient's bilateral eyes are blind. No discharge from ear or nose. Moist oral mucosa. Clear oropharynx.   NECK: Supple. No JVD or carotid bruits. No lymphadenopathy. No thyromegaly.   CARDIOVASCULAR: S1, S2 regular rate, rhythm. Has a systolic murmur 4/6. No gallop.  PULMONARY: Bilateral air entry. No wheezing or rales. No use of accessory muscles to breathe.   ABDOMEN: Soft. No distention or tenderness. No organomegaly. Bowel sounds present.   EXTREMITIES: No edema, clubbing, or cyanosis. No calf tenderness. Strong bilateral pedal pulses.   SKIN: No rash or jaundice.   NEUROLOGY: Alert and oriented x3. No focal deficit. Power 5/5. Sensation intact.   LABORATORY, DIAGNOSTIC AND RADIOLOGICAL DATA: CAT scan of head no acute intracranial abnormality. Urinalysis is negative. CBC normal. Glucose 93, BUN 13, creatinine 1.22. Electrolytes are normal. Troponin 0.02. EKG showed normal sinus rhythm at 79 beats per minute with left ventricular hypertrophy.   IMPRESSION:  1. Hypertension malignancy.  2. Hyperlipidemia.  3. Constipation.  4. Breast cancer left side.  5. History of left lower extremity deep venous thrombosis. 6. History of chronic  anemia. 7. Legally blind.   PLAN OF TREATMENT:   1. The patient will be admitted to telemetry floor. Will and start Lopressor, lisinopril and give hydralazine IV p.r.n. to control blood pressure. May need to adjust hypertension medications depending on blood pressure.  2. We will start aspirin 81 mg p.o. daily. Follow up lipid panel.  3. For constipation we will start Colace and milk of magnesia p.r.n.  4. For breast cancer continue letrozole.   5. GI and deep vein thrombosis prophylaxis.   Discussed the patient's situation and plan of treatment with patient, patient's husband and son.    TIME SPENT: About 50 minutes.   ____________________________ Shaune PollackQing Abhimanyu Cruces, MD qc:cms D: 07/26/2012 19:21:24 ET T: 07/27/2012 05:54:30 ET  JOB#: [id number] cc: Shaune PollackQing Keionte Swicegood, MD, <Dictator> Serita ShellerErnest B. Maryellen PileEason, MD Shaune PollackQING Leng Montesdeoca MD ELECTRONICALLY SIGNED 07/28/2012 15:07
